# Patient Record
Sex: Female | Born: 1975 | Race: Black or African American | Marital: Single | Smoking: Current every day smoker
Health system: Northeastern US, Community
[De-identification: ages and names within clinical notes are randomized; demographics above are authoritative.]

---

## 2000-12-23 ENCOUNTER — Emergency Department (HOSPITAL_BASED_OUTPATIENT_CLINIC_OR_DEPARTMENT_OTHER): Payer: Self-pay | Admitting: Emergency Medicine

## 2003-09-08 ENCOUNTER — Emergency Department (HOSPITAL_BASED_OUTPATIENT_CLINIC_OR_DEPARTMENT_OTHER): Payer: Self-pay | Admitting: Emergency Medicine

## 2014-02-22 ENCOUNTER — Encounter (HOSPITAL_BASED_OUTPATIENT_CLINIC_OR_DEPARTMENT_OTHER): Payer: Self-pay

## 2014-02-22 ENCOUNTER — Emergency Department (HOSPITAL_BASED_OUTPATIENT_CLINIC_OR_DEPARTMENT_OTHER)
Admission: RE | Admit: 2014-02-22 | Disposition: A | Discharge status: Home / Self Care | Payer: Self-pay | Source: Emergency Department | Attending: Emergency Medicine | Admitting: Emergency Medicine

## 2014-02-22 LAB — POC URINALYSIS
BILIRUBIN, URINE: NEGATIVE
GLUCOSE,URINE: NEGATIVE
KETONE, URINE: 15 — AB
NITRITE, URINE: NEGATIVE
OCCULT BLOOD, URINE: NEGATIVE
PH URINE: 5 (ref 5.0–8.0)
PROTEIN, URINE: NEGATIVE
SPECIFIC GRAVITY, URINE: 1.025 (ref 1.003–1.030)
UROBILINOGEN URINE: 0.2 (ref 0.2–1.0)

## 2014-02-22 LAB — URINE PREGNANCY TEST (POINT OF CARE): HCG QUALITATIVE URINE: NEGATIVE

## 2014-02-22 MED ORDER — ONDANSETRON HCL 4 MG/2ML IJ SOLN
4.0000 mg | Freq: Once | INTRAMUSCULAR | Status: AC
Start: 2014-02-22 — End: 2014-02-22
  Administered 2014-02-22: 4 mg via INTRAVENOUS
  Filled 2014-02-22: qty 2

## 2014-02-22 MED ORDER — ACETAMINOPHEN 325 MG PO TABS
650.00 mg | ORAL_TABLET | Freq: Once | ORAL | Status: AC
Start: 2014-02-22 — End: 2014-02-22
  Administered 2014-02-22: 650 mg via ORAL
  Filled 2014-02-22: qty 2

## 2014-02-22 MED ORDER — SODIUM CHLORIDE 0.9 % IV BOLUS
1000.0000 mL | Freq: Once | INTRAVENOUS | Status: AC
Start: 2014-02-22 — End: 2014-02-22
  Administered 2014-02-22: 1000 mL via INTRAVENOUS

## 2014-02-22 MED ORDER — METOCLOPRAMIDE HCL 5 MG/ML IJ SOLN
10.0000 mg | Freq: Once | INTRAMUSCULAR | Status: AC
Start: 2014-02-22 — End: 2014-02-22
  Administered 2014-02-22: 10 mg via INTRAVENOUS
  Filled 2014-02-22: qty 2

## 2014-02-22 MED ORDER — ONDANSETRON HCL 4 MG PO TABS
4.00 mg | ORAL_TABLET | Freq: Four times a day (QID) | ORAL | Status: AC | PRN
Start: 2014-02-22 — End: 2014-02-24

## 2014-02-22 NOTE — Narrator Note (Signed)
Patient Disposition    Patient education for diagnosis, medications, activity, diet and follow-up.  Patient left ED 11:51 PM.  Patient rep received written instructions.  Interpreter to provide instructions: No    Patient belongings with patient: YES    Have all existing LDAs been addressed? Yes    Have all IV infusions been stopped? Yes    Discharged to: Discharged to home ambulatory with dc instructions on prescribed med,f/u,and home care. Pt verbalizes understanding.

## 2014-02-22 NOTE — ED Triage Note (Signed)
Self presents with diarrhea, vomiting and diffuse abdominal pain 10/10 since 0300 today. Denies urinary symptoms, chest pain or dyspnea. Afebrile, denies fevers/chills at home.

## 2014-02-22 NOTE — Discharge Instructions (Signed)
DIAGNOSIS & TREATMENT:  You were seen in a [facility] Department for Nausea, Vomiting, diarrhea and abdominal pain.  You were treated with reglan, zofran, tylenol and IV fluids.    FURTHER CARE:  Hydration:  Please drink water or full calorie Gatorade as being sick can cause you to become very dehydrated quickly making you feel worse including severe headaches. We recommend Pedialyte. You should target to have your urine light yellow to clear. Avoid drinks with caffeine like soda, coffee, and tea as caffeine will dehydrate you.     Diet:  You should restart you diet with soft, bland foods. Start with clear fluids and advance your diet as you feel you can tolerate additional foods. Please avoid spicey foods, caffeine, tomato sauces, chocolate or other acidic foods while your stomach recovers.    NEW MEDICATIONS:  Zofran (ondansetron). This is to help reduce your symptoms of nausea and vomiting.    WHEN SHOULD YOU BE SEEN NEXT?  Please call your doctor and be seen with in the next 5 days for re-evaluation if your symptoms are not improving. If you do not have a primary care doctor or would like to transfer your primary care to [facility], please call [phone] to set one up an appointment.    WHEN SHOULD YOU RETURN TO THE ED?  Please return to the emergency room if you are unable to keep down fluids, you stop urinating, you develop severe abdominal pain, you develop a fever, your symptoms worsen, you get new symptoms or you are unable to schedule follow up care.     Abdominal Pain       Abdominal pain can be caused by many things. Your caregiver decides the seriousness of your pain by an examination and possibly blood tests and X-rays. Many cases can be observed and treated at home. Most abdominal pain is not caused by a disease and will probably improve without treatment. However, in many cases, more time must pass before a clear cause of the pain can be found. Before that  point, it may not be known if you need more testing, or if hospitalization or surgery is needed.   HOME CARE INSTRUCTIONS   Do not take laxatives unless directed by your caregiver.   Take pain medicine only as directed by your caregiver.   Only take over-the-counter or prescription medicines for pain, discomfort, or fever as directed by your caregiver.   Try a clear liquid diet (broth, tea, or water) for as long as directed by your caregiver. Slowly move to a bland diet as tolerated.  SEEK IMMEDIATE MEDICAL CARE IF:   The pain does not go away.   You have a fever.   You keep throwing up (vomiting).   The pain is felt only in portions of the abdomen. Pain in the right side could possibly be appendicitis. In an adult, pain in the left lower portion of the abdomen could be colitis or diverticulitis.   You pass bloody or black tarry stools.  MAKE SURE YOU:   Understand these instructions.   Will watch your condition.   Will get help right away if you are not doing well or get worse.  Document Released: 10/16/2004 Document Revised: 03/31/2011 Document Reviewed: 08/25/2007   [facility] Patient Information 2013 [place], [place].         Index Spanish version   Stomach Flu   What is stomach flu?   Stomach flu is a viral infection that affects the  stomach and [place] intestine. It is also called viral gastroenteritis. The illness is usually brief, lasting 1 to 3 days.   What is the cause?   Many different viruses can cause stomach flu, including rotaviruses, adenoviruses, and the [place] virus. The body fluids of infected people contain the virus, sometimes even before their symptoms begin. The virus can be spread by direct contact with an infected person. For example, you might get it by kissing or shaking hands or by sharing food, drink, or eating utensils.   The virus irritates the stomach and intestine. When the stomach and intestine are irritated, they dont work as well as they should. Food may move faster through your  digestive tract.   What are the symptoms?   When you have stomach flu, you may have one or more of the following symptoms:   nausea   vomiting   stomach cramps   diarrhea   mild fever   tiredness   chills   loss of appetite   muscle aches   The illness may develop over a period of hours, or it may suddenly start with stomach cramps, vomiting, or diarrhea.   Some bacteria, parasites, medicines, or other medical conditions can cause similar symptoms. If your symptoms are unusually severe or last longer than a couple days, your healthcare provider can check to see if the diarrhea is caused by something other than a virus.   How is it diagnosed?   Your healthcare provider will ask about your symptoms. He or she will examine you. You may have lab tests to rule out more serious illnesses and to check for problems that can be caused by stomach flu, such as dehydration.   How is it treated?   The most important thing to do is to rest the stomach and intestine. You can do this by not eating solid food for a while and drinking only clear liquids. As your symptoms go away, you can start eating soft bland foods that are easy to digest.   If you have been vomiting a lot, it is best to have only [place], frequent sips of liquids. Drinking too much at once, even an ounce or two, may cause more vomiting.   Your choice of liquids is important. If water is the only liquid you can drink without vomiting, that is OK. However, if you have been vomiting often or for a long time, you must replace the minerals, sodium and potassium, that are lost when you vomit. Ask your healthcare provider what sport drinks or other rehydration drinks could help you replace these minerals.   Other clear liquids you can drink are weak tea and apple juice. You may also drink soft drinks without caffeine (such as 7-UP) after letting them go flat (lose their carbonation). It may be easier to keep down liquids that are cold. Suck on ice chips or Popsicles if you  feel too nauseated to even sip fluids. Avoid liquids that are acidic (such as orange juice) or caffeinated (such as coffee) or have a lot of carbonation. Dont drink milk until you no longer have diarrhea.   You may start eating soft, plain foods when you have not vomited for several hours and are able to drink clear liquids without further upset. Soda crackers, toast, plain noodles, gelatin, soft-boiled eggs, applesauce, and bananas are good first choices.   Avoid foods that are acidic, spicy, fatty, or fibrous (such as meats, coarse grains, vegetables). Also avoid dairy products. You may start  eating these foods again in 3 days or so, when all signs of illness have passed.   If you have cramps or stomach pain, it may help to put a hot water bottle or electric heating pad on your stomach. Cover the hot water bottle with a towel or set the heating pad on low so you dont burn your skin.   Sometimes treatment includes prescription medicine to prevent nausea and vomiting or diarrhea.   Nonprescription medicine is available for the treatment of diarrhea and can be very effective. If you use it, make sure you use only the dose recommended on the package. Dont use it for more than 2 days without checking with your healthcare provider. If you have chronic health problems, always check with your healthcare provider before you use any medicine for diarrhea.   How long do the effects last?   Stomach flu rarely lasts longer than 1 to 3 days. However, it may be 1 to 2 weeks before your bowel habits are completely back to normal. It can be a serious illness in people whose immune system isnt working properly.   Dehydration is a potentially serious complication of stomach flu. It can happen if your body loses too much fluid because you keep vomiting or having diarrhea. If you are severely dehydrated, you may need to be given fluids intravenously (IV). In children and older adults, dehydration can quickly become life  threatening.   How can I take care of myself?   Rest your stomach and intestines by following the suggested guidelines for your diet during the illness, but make sure you prevent dehydration by drinking enough liquids. Drink just [place] amounts or sips if you are vomiting and having trouble keeping food and liquids in your stomach.   Dont take aspirin, ibuprofen, or other nonsteroidal anti-inflammatory medicines (NSAIDs) without checking first with your healthcare provider.   Call your healthcare provider if:   Your symptoms are getting worse.   You keep having severe symptoms (vomiting or frequent diarrhea) for more than 1 or 2 days, or you are just not getting better after a few days.   You start having symptoms that are not usually caused by stomach flu, such as blood in your vomit, bloody diarrhea, or severe abdominal pain.  What can I do to help prevent stomach flu?   The single, most helpful way to prevent the spread of stomach flu is frequent, thorough hand washing. Also, avoid contact with the body fluids of an infected person, including saliva. Don't share food with someone who has stomach flu.   Developed by [employer].   Adult Advisor 2012.1 published by [employer].  Last modified: 2010-02-11  Last reviewed: 2009-12-20   This content is reviewed periodically and is subject to change as new health information becomes available. The information is intended to inform and educate and is not a replacement for medical evaluation, advice, diagnosis or treatment by a healthcare professional.   References   Adult Advisor 2012.1 Index    2012 [employer] and/or its affiliates. All rights reserved.

## 2014-02-22 NOTE — Narrator Note (Signed)
[facility]

## 2014-02-23 NOTE — ED Provider Notes (Signed)
I performed a history and physical examination of the patient and discussed the patient's management with Marline Backbone, PA-C. I reviewed PAs note and agree with the documented finding and plan of care.    Briefly, this is a 39 year old female patient with one day of nausea, vomiting, and diarrhea. A she initially had diarrhea, then developed nausea and vomiting later. 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    On my physical exam:  Patient Vitals for the past 720 hrs:   Temp Pulse Resp BP SpO2   02/22/14 1925 98.5 F 98 16 119/61 mmHg 100 %   02/22/14 2120 - 92 20 109/50 mmHg 98 %   02/22/14 2331 97.3 F 88 18 110/62 mmHg 100 %       GENERAL:   no acute distress, non-toxic   SKIN:  Warm & Dry, no rash  HEAD:  NCAT. Sclerae are anicteric and aninjected, oropharynx is clear with tacky mucous membranes  NECK:  Supple. No stridor.  LUNGS:  Clear to auscultation bilaterally. No wheezes, rales, rhonchi.   HEART:  RRR.  No murmurs, rubs, or gallops.   ABDOMEN:  Soft, NTND.  No involuntary guarding or rebound.   EXTREMITIES:  No obvious deformities.  Warm and well perfused.  No edema.   NEUROLOGIC:  Alert and oriented x3; moves all extremities well; speaking in clear fluent sentences. Normal gait without ataxia; nonfocal.   PSYCHIATRIC:  Appropriate for age, time of day, and situation    Diagnostics:   Results for orders placed or performed during the hospital encounter of 02/22/14 (from the past 24 hour(s))   POC Urinalysis    Collection Time: 02/22/14 11:24 PM   Result Value    COLOR DARK YELLOW (A)    CLARITY SL CLOUDY (A)    GLUCOSE,URINE NEGATIVE    BILIRUBIN, URINE NEGATIVE    KETONE, URINE 15 (A)    SPECIFIC GRAVITY, URINE 1.025    UROBILINOGEN URINE 0.2    OCCULT BLOOD, URINE NEGATIVE    PH URINE 5.0    PROTEIN, URINE NEGATIVE    NITRITE, URINE NEGATIVE    LEUKOCYTE ESTERASE TRACE (A)   Urine Pregnancy (Point of Care)    Collection Time:  02/22/14 11:31 PM   Result Value    HCG QUALITATIVE URINE Negative    ONBOARD CONTROL PRESENT? Yes       ED Course and Medical Decision-making:  39 year old female patient presents with nausea, vomiting, diarrhea, most consistent with viral gastroenteritis. Urine hCG is negative. Patient was given IV fluids, Zofran, acetaminophen, Reglan, with improvement in her symptoms. She was discharged with a short prescription for Zofran.    Reasons to return to the ED were reviewed in detail. All questions were answered. The patient agrees with this plan and disposition.      Condition on Discharge: Improved and Stable      Diagnosis/Diagnoses:  Nausea vomiting and diarrhea  (primary encounter diagnosis)    Medications administered at this visit:     Medication Orders Placed This Encounter  sodium chloride 0.9 % IV bolus 1,000 mL   Sig:   ondansetron (ZOFRAN) injection 4 mg   Sig:   sodium chloride 0.9 % IV bolus 1,000 mL   Sig:   acetaminophen (TYLENOL) tablet 650 mg   Sig:   metoclopramide (REGLAN) injection 10 mg   Sig:   ondansetron (ZOFRAN)  4 MG tablet   Sig: Take 1 tablet by mouth every 6 (six) hours as needed for Nausea. for nausea and vomitting   Dispense:  8 tablet   Refill:  0    New prescriptions from this visit:  Discharge Medication List as of 02/22/2014 11:06 PM    START taking these medications    ondansetron (ZOFRAN) 4 MG tablet  Take 1 tablet by mouth every 6 (six) hours as needed for Nausea. for nausea and vomittingTamperproof, Disp-8 tablet, R-0           Nestor LewandowskyLaura Lakelynn Severtson, MD, MPH  02/23/2014   Dept.of Emergency Medicine  [facility]    This Emergency Department patient encounter note was created using voice-recognition software and in real time during the ED visit. Please excuse any typographical errors that have not yet been reviewed and corrected.

## 2014-02-26 NOTE — ED Provider Notes (Signed)
ED nursing record was reviewed. Prior records as available electronically through the Epic record were reviewed.  Patient was seen along with Dr. Windell HummingbirdJanneck and management was discussed with them.    HPI:    This 39 year old female patient presents to the Emergency Department with chief complaint of nausea, vomiting and diarrhea.  He reports that this morning she woke up with diffuse lower abdominal cramps and diarrhea.  Her abdominal cramps resolved after having few episodes of diarrhea.  She went back to bed, she woke with nausea and then had multiple episodes of emesis throughout the day.  She denies any hematemesis.  Denies any blood or mucus in the stool.  Denies any sick contacts.  He reports she had steak and cheese sandwich from a restaurant for dinner last night and believes that may have triggered her symptoms.    Denies any fevers, chills, soreness of breath, chest pain, abdominal pain, urinary symptoms, or vaginal symptoms.  ROS: Pertinent positives were reviewed as per the HPI above. All other systems were reviewed and are negative.      Past Medical History/Problem list:  No past medical history on file.  There is no problem list on file for this patient.        Past Surgical History: No past surgical history on file.      Medications:   No current facility-administered medications on file prior to encounter.  No current outpatient prescriptions on file prior to encounter.      Social History:   Social History   Marital Status: Single  Spouse Name: N/A    Years of Education: N/A  Number of Children: N/A     Occupational History  None on file     Social History Main Topics   Smoking status: Current Every Day Smoker     Smokeless tobacco: Not on file    Alcohol Use: No    Drug Use: No    Sexual Activity: Not on file   Not on file  Other Topics Concern   None on file     Social History Narrative   None on file           Allergies:  Review of Patient's Allergies indicates:  No Known Allergies      Physical  Exam:  BP 110/62 mmHg   Pulse 88   Temp(Src) 97.3 F   Resp 18   Wt 79.379 kg (175 lb)   SpO2 100%   LMP 02/15/2014 (Approximate)    GENERAL: Well appearing, No acute distress, non-toxic.   SKIN:  Warm & Dry, no erythema or rash.  HEAD:  NCAT.  LUNGS:  Clear to auscultation bilaterally. No wheezes, rales, rhonchi.   HEART:  RRR.  No murmurs, rubs, or gallops.   ABDOMEN:  Soft, NTND. No involuntary guarding or rebound.   EXTREMITIES:  No obvious deformities.  CSM intact x 4.     GENITOURINARY:  No CVA tenderness.   NEUROLOGIC:  Alert and oriented x4; moves all extremities well; speaking in clear fluent sentences.  PSYCHIATRIC:  Appropriate for age, time of day, and situation    ED Course and Medical Decision-making:  This 39 year old female patient was seen and evaluated for nausea, vomiting and diarrhea.  The patient has no abdominal pain or tenderness to palpation, abdomen is nondistended with normoactive bowel sounds -intra-abdominal processes are unlikely.  Differential is a viral gastroenteritis or possible foodborne illness.  Urine pregnancy negative.  She was given  IV fluids, Tylenol,  Zofran, and Reglan.  She reported feeling improvement of her symptoms.  She was given oral challenge with ginger ale was successfully able to complete without issue.  Patient was discharged home with short supply of Zofran.  Reasons to return to the emergency department were discussed in detail.    Condition: Stable  Disposition: Home      Diagnosis/Diagnoses:  Nausea vomiting and diarrhea  (primary encounter diagnosis)    Nicholos Johns, PA-C    This Emergency Department patient encounter note was created using voice-recognition software and in real time during the ED visit. Please excuse any typographical errors that have not been edited out.
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
